# Patient Record
Sex: Female | Born: 1955
Health system: Southern US, Community
[De-identification: ages and names within clinical notes are randomized; demographics above are authoritative.]

## PROBLEM LIST (undated history)

## (undated) DIAGNOSIS — E7800 Pure hypercholesterolemia, unspecified: Secondary | ICD-10-CM

## (undated) DIAGNOSIS — F329 Major depressive disorder, single episode, unspecified: Secondary | ICD-10-CM

## (undated) DIAGNOSIS — F419 Anxiety disorder, unspecified: Secondary | ICD-10-CM

## (undated) DIAGNOSIS — IMO0002 Reserved for concepts with insufficient information to code with codable children: Secondary | ICD-10-CM

## (undated) DIAGNOSIS — E039 Hypothyroidism, unspecified: Secondary | ICD-10-CM

## (undated) DIAGNOSIS — F32A Depression, unspecified: Secondary | ICD-10-CM

## (undated) DIAGNOSIS — G4733 Obstructive sleep apnea (adult) (pediatric): Secondary | ICD-10-CM

## (undated) DIAGNOSIS — G43909 Migraine, unspecified, not intractable, without status migrainosus: Secondary | ICD-10-CM

## (undated) DIAGNOSIS — M329 Systemic lupus erythematosus, unspecified: Secondary | ICD-10-CM

## (undated) HISTORY — DX: Anxiety disorder, unspecified: F41.9

## (undated) HISTORY — DX: Systemic lupus erythematosus, unspecified: M32.9

## (undated) HISTORY — DX: Obstructive sleep apnea (adult) (pediatric): G47.33

## (undated) HISTORY — DX: Migraine, unspecified, not intractable, without status migrainosus: G43.909

## (undated) HISTORY — DX: Pure hypercholesterolemia, unspecified: E78.00

## (undated) HISTORY — DX: Major depressive disorder, single episode, unspecified: F32.9

## (undated) HISTORY — DX: Depression, unspecified: F32.A

## (undated) HISTORY — DX: Hypothyroidism, unspecified: E03.9

## (undated) HISTORY — DX: Reserved for concepts with insufficient information to code with codable children: IMO0002

---

## 2006-02-23 ENCOUNTER — Ambulatory Visit: Payer: Self-pay | Admitting: Family Medicine

## 2007-12-15 ENCOUNTER — Ambulatory Visit: Payer: Self-pay | Admitting: Family Medicine

## 2011-09-21 ENCOUNTER — Ambulatory Visit: Payer: Self-pay | Admitting: Family Medicine

## 2012-10-18 ENCOUNTER — Other Ambulatory Visit (HOSPITAL_BASED_OUTPATIENT_CLINIC_OR_DEPARTMENT_OTHER): Payer: Self-pay | Admitting: Rheumatology

## 2012-10-18 DIAGNOSIS — M329 Systemic lupus erythematosus, unspecified: Secondary | ICD-10-CM

## 2012-10-18 DIAGNOSIS — R413 Other amnesia: Secondary | ICD-10-CM

## 2012-10-20 ENCOUNTER — Other Ambulatory Visit (HOSPITAL_BASED_OUTPATIENT_CLINIC_OR_DEPARTMENT_OTHER): Payer: Self-pay | Admitting: Rheumatology

## 2012-10-20 DIAGNOSIS — M329 Systemic lupus erythematosus, unspecified: Secondary | ICD-10-CM

## 2012-10-20 DIAGNOSIS — R413 Other amnesia: Secondary | ICD-10-CM

## 2012-10-29 ENCOUNTER — Ambulatory Visit (HOSPITAL_BASED_OUTPATIENT_CLINIC_OR_DEPARTMENT_OTHER)
Admission: RE | Admit: 2012-10-29 | Discharge: 2012-10-29 | Disposition: A | Discharge status: Home / Self Care | Payer: BC Managed Care – PPO | Source: Ambulatory Visit | Attending: Rheumatology | Admitting: Rheumatology

## 2012-10-29 ENCOUNTER — Other Ambulatory Visit (HOSPITAL_BASED_OUTPATIENT_CLINIC_OR_DEPARTMENT_OTHER): Payer: BC Managed Care – PPO

## 2012-10-29 DIAGNOSIS — I6789 Other cerebrovascular disease: Secondary | ICD-10-CM | POA: Insufficient documentation

## 2012-10-29 DIAGNOSIS — R413 Other amnesia: Secondary | ICD-10-CM

## 2012-10-29 DIAGNOSIS — M329 Systemic lupus erythematosus, unspecified: Secondary | ICD-10-CM

## 2012-12-20 ENCOUNTER — Other Ambulatory Visit: Payer: Self-pay | Admitting: Family Medicine

## 2012-12-20 ENCOUNTER — Ambulatory Visit
Admission: RE | Admit: 2012-12-20 | Discharge: 2012-12-20 | Disposition: A | Discharge status: Home / Self Care | Payer: No Typology Code available for payment source | Source: Ambulatory Visit | Attending: Family Medicine | Admitting: Family Medicine

## 2012-12-20 DIAGNOSIS — M329 Systemic lupus erythematosus, unspecified: Secondary | ICD-10-CM

## 2012-12-20 DIAGNOSIS — Z006 Encounter for examination for normal comparison and control in clinical research program: Secondary | ICD-10-CM

## 2012-12-20 DIAGNOSIS — F341 Dysthymic disorder: Secondary | ICD-10-CM

## 2012-12-20 DIAGNOSIS — R413 Other amnesia: Secondary | ICD-10-CM

## 2012-12-22 DIAGNOSIS — R413 Other amnesia: Secondary | ICD-10-CM

## 2012-12-22 DIAGNOSIS — F341 Dysthymic disorder: Secondary | ICD-10-CM

## 2013-01-20 ENCOUNTER — Telehealth: Payer: Self-pay | Admitting: Neurology

## 2013-01-20 NOTE — Telephone Encounter (Signed)
Pls call pt to schedule FU w me, after her recent sleep study. Pls route results to Dr. Pearlean Brownie for review.  thx s

## 2013-01-25 ENCOUNTER — Encounter: Payer: Self-pay | Admitting: Neurology

## 2013-01-26 ENCOUNTER — Ambulatory Visit (INDEPENDENT_AMBULATORY_CARE_PROVIDER_SITE_OTHER): Payer: BC Managed Care – PPO | Admitting: Neurology

## 2013-01-26 ENCOUNTER — Encounter: Payer: Self-pay | Admitting: Neurology

## 2013-01-26 VITALS — BP 120/80 | HR 79 | Temp 98.5°F | Ht >= 80 in | Wt 162.0 lb

## 2013-01-26 DIAGNOSIS — H547 Unspecified visual loss: Secondary | ICD-10-CM

## 2013-01-26 DIAGNOSIS — R0683 Snoring: Secondary | ICD-10-CM | POA: Insufficient documentation

## 2013-01-26 DIAGNOSIS — E039 Hypothyroidism, unspecified: Secondary | ICD-10-CM

## 2013-01-26 DIAGNOSIS — R404 Transient alteration of awareness: Secondary | ICD-10-CM

## 2013-01-26 DIAGNOSIS — G2581 Restless legs syndrome: Secondary | ICD-10-CM | POA: Insufficient documentation

## 2013-01-26 DIAGNOSIS — F411 Generalized anxiety disorder: Secondary | ICD-10-CM

## 2013-01-26 DIAGNOSIS — R0989 Other specified symptoms and signs involving the circulatory and respiratory systems: Secondary | ICD-10-CM

## 2013-01-26 DIAGNOSIS — R0609 Other forms of dyspnea: Secondary | ICD-10-CM

## 2013-01-26 DIAGNOSIS — R400 Somnolence: Secondary | ICD-10-CM | POA: Insufficient documentation

## 2013-01-26 DIAGNOSIS — M329 Systemic lupus erythematosus, unspecified: Secondary | ICD-10-CM

## 2013-01-26 DIAGNOSIS — R413 Other amnesia: Secondary | ICD-10-CM

## 2013-01-26 NOTE — Patient Instructions (Addendum)
Recommend she follow up with Dr. Merton Border are 4 discussion for treatment options for sleep apnea. Continue B12 injections. Followup with Dr. Corliss Skains for treatment for lupus. No further diagnostic neurological testing is necessary at this point. No followup with me his necessary.

## 2013-01-29 NOTE — Progress Notes (Addendum)
HPI: Ms  Dawn Butler is a 57 year old Caucasian lady with long-standing symptoms of short-term memory and mild cognitive impairment on cardiology. She has suboptimally treated underlying depression and recently been diagnosed with nonspecific white matter hyperintensities on brain MRI scan and diagnosis of possible lupus and recent workup shows mild vitamin B12 deficiency and sleep apnea. She returns today for followup of her last visit on 11/16/2012. She continues to have significant multifocal symptoms which are unchanged. March mild short-term memory difficulties and trouble concentration and performance at work continue unabated. She had an EEG done on 11/22/2012 office which was normal. Brainstem and visual evoked potentials dated 11/29/2012 were both normal as well. Lab work on 11/16/2012 showed slightly elevated TSH of 7.5  and homocysteine of 20.5. T4-T3 uptake and free throxine  index were however normal. RPR was nonreactive. Vitamin B12 was low at 195. She has been started on B12 shots primary physician. Polysomnogram sleep study on 12/28/2012 shows evidence of mild sleep apnea. She has an upcoming visit for CPAP titration with Dr. Frances Furbish in April. Neuropsychological testing done on 12/20/2012 and 12/22/2012 by Dr. Leonides Cave showed a normal neurocognitive profile. Specifically functioning within the areas of attention/concentration, memory and negative functioning, language and visuospatial organization and constructional praxis and fine motor skills was normal or above normal. Neuropsychological evaluation suggested that she has overestimated or exaggerated her cognitive problems. As the discrepancy between her symptom report and performance on neuropsychological testing was exceedingly large. ROS: 14 system review of systems is positive for fatigue, rash, visual floaters, snoring, joint pain, skin sensitivity, memory loss, confusion, dizziness, sleepiness, snoring, restless legs, depression, racing thoughts, not  enough sleep and decreased energy. Physical Exam General: well developed, well nourished, seated, in no evident distress Head: head normocephalic and atraumatic. Orohparynx benign Neck: supple with no carotid or supraclavicular bruits Cardiovascular: regular rate and rhythm, no murmurs Musculoskeletal no deformity Skin no rash  Neurologic Exam Mental Status: Awake and fully alert. Oriented to place and time. Recent and remote memory intact. Attention span, concentration and fund of knowledge appropriate. Mood and affect appropriate.  Cranial Nerves: Fundoscopic exam reveals sharp disc margins. Pupils equal, briskly reactive to light. Extraocular movements full without nystagmus. Visual fields full to confrontation. Hearing intact and symmetric to finer snap. Facial sensation intact. Face, tongue, palate move normally and symmetrically. Neck flexion and extension normal.  Motor: Normal bulk and tone. Normal strength in all tested extremity muscles. Sensory.: intact to tough and pinprick and vibratory.  Coordination: Rapid alternating movements normal in all extremities. Finger-to-nose and heel-to-shin performed accurately bilaterally. Gait and Station: Arises from chair without difficulty. Stance is normal. Gait demonstrates normal stride length and balance . Able to heel, toe and tandem walk without difficulty.  Reflexes: 1+ and symmetric. Toes downgoing.     ASSESSMENT::  57 year old lady with long-standing symptoms of short-term memory and mild cognitive impairment of unclear etiology with underlying depression. She has recently been diagnosed with mild B12 deficiency and sleep apnea which may be contributing.    PLAN: Continue B12 replacement and keep upcoming appointment  for CPAP titration. Return for followup in the future only as necessary.

## 2013-02-08 ENCOUNTER — Ambulatory Visit (INDEPENDENT_AMBULATORY_CARE_PROVIDER_SITE_OTHER): Payer: BC Managed Care – PPO | Admitting: Neurology

## 2013-02-08 ENCOUNTER — Encounter: Payer: Self-pay | Admitting: Neurology

## 2013-02-08 VITALS — BP 140/84 | HR 76 | Temp 98.0°F | Ht 68.0 in | Wt 160.0 lb

## 2013-02-08 DIAGNOSIS — M329 Systemic lupus erythematosus, unspecified: Secondary | ICD-10-CM

## 2013-02-08 DIAGNOSIS — G4733 Obstructive sleep apnea (adult) (pediatric): Secondary | ICD-10-CM | POA: Insufficient documentation

## 2013-02-08 DIAGNOSIS — G2581 Restless legs syndrome: Secondary | ICD-10-CM

## 2013-02-08 DIAGNOSIS — R413 Other amnesia: Secondary | ICD-10-CM

## 2013-02-08 HISTORY — DX: Obstructive sleep apnea (adult) (pediatric): G47.33

## 2013-02-08 NOTE — Patient Instructions (Addendum)
I think overall you are doing fairly well, but I do want to suggest treating your sleep apnea with CPAP. We will therefore, schedule you for a repeat sleep study for titration. I will see you back afterwards.   Remember to drink plenty of fluid, eat healthy meals and do not skip any meals. Try to eat protein with a every meal and eat a healthy snack such as fruit or nuts in between meals. Try to keep a regular sleep-wake schedule and try to exercise daily, particularly in the form of walking, 20-30 minutes a day, if you can.   As far as your medications are concerned, I would like to suggest no changes.   Please also call us for any test results so we can go over those with you on the phone. Brett Canales is my clinical assistant and will answer any of your questions and relay your messages to me and also relay most of my messages to you.  Our phone number is [phone]. We also have an after hours call service for urgent matters and there is a physician on-call for urgent questions. For any emergencies you know to call 911 or go to the nearest emergency room.

## 2013-02-08 NOTE — Progress Notes (Signed)
Subjective:    Patient ID: Dawn Butler is a 57 y.o. female.  HPI  Interim history:  Dawn Butler is a very pleasant 57 year old right-handed woman who presents for followup consultation of her sleep disorder. She is unaccompanied today. I first met her on 12/22/2012 at which time she was complaining of daytime sleepiness and restless leg symptoms. She has an underlying medical history of hypothyroidism, hyperlipidemia, migraine headaches, depression, anxiety and lupus. I asked her to come back for sleep study and she had a baseline sleep study on 12/28/2012. I went over her test results with her in detail today. Her sleep efficiency was normal at 91.7% with a latency to sleep of 22.5 minutes and wake after sleep onset of 13 minutes with mild sleep fragmentation noted. Her arousal index was normal at 7.5 arousals per hour. She had a normal percentage of stage I sleep and increased percentage of stage II sleep, and decreased percentage of slow-wave sleep at 5.6% and a mildly decreased percentage of REM sleep at 16.8% with a latency to REM sleep of 102.5 minutes. She had mild pruritic leg movements at 15.2 per hour resulting in very little arousals of 0.9 per hour. Mild intermittent snoring was noted. She had an AHI of 6.5 per hour, rising to 25.5 per hour and REM sleep and 17.9 per hour in the supine position. Her baseline oxygen saturation was noted to be 94% and her nadir was 84% and REM sleep.  She has recently also seen Dr. Pearlean Brownie for her memory loss and has undergone blood work, EEG and evoked potential is. Her TSH was elevated at 7.52. Her EEG was reported as normal in the awake state. She has been started on be vitamins and had an increase in her Synthroid. Her homocystine level was 20.5. Her current medications are baby aspirin once daily, Cymbalta 60 mg daily, levothyroxine, Plaquenil. She has seen Dr. Pearlean Brownie in FU on 01/26/13 and he explained her neuropsych test results to her. She feels worse in the  last month, with respect to pain, feeling fatigued and about a month ago she furnished a report at work and did not have recollection that she already did this report and tried to make another report.  She is wondering if she is having a lupus flare up. Her appointment with her rheumatologist is not until August.  Her Past Medical History Is Significant For: Past Medical History  Diagnosis Date  . Hypothyroidism   . High cholesterol   . Migraine   . Depression   . Anxiety     Her Past Surgical History Is Significant For: No past surgical history on file.  Her Family History Is Significant For: Family History  Problem Relation Age of Onset  . Cancer - Lung Maternal Grandmother   . Cancer Maternal Grandmother     thorat    Her Social History Is Significant For: History   Social History  . Marital Status: Married    Spouse Name: N/A    Number of Children: 1  . Years of Education: college   Occupational History  . [employer]    Social History Main Topics  . Smoking status: Current Every Day Smoker -- 1.50 packs/day  . Smokeless tobacco: None  . Alcohol Use: None  . Drug Use: None  . Sexually Active: None   Other Topics Concern  . None   Social History Narrative  . None    Her Allergies Are:  Allergies  Allergen Reactions  . Naprosyn (  Naproxen)   :   Her Current Medications Are:  Outpatient Encounter Prescriptions as of 02/08/2013  Medication Sig Dispense Refill  . aspirin 81 MG tablet Take 81 mg by mouth daily.      . DULoxetine (CYMBALTA) 60 MG capsule       . hydroxychloroquine (PLAQUENIL) 200 MG tablet       . levothyroxine (SYNTHROID, LEVOTHROID) 112 MCG tablet       . LOTEMAX 0.5 % ophthalmic suspension       . vitamin B-12 (CYANOCOBALAMIN) 1000 MCG tablet Take 1,000 mcg by mouth daily.      . Vitamin D, Ergocalciferol, (DRISDOL) 50000 UNITS CAPS Take 1 Units by mouth every 30 (thirty) days.        No facility-administered encounter medications on  file as of 02/08/2013.  :  Review of Systems  Constitutional: Positive for fatigue.  Eyes: Positive for visual disturbance (blurred vision).  Respiratory:       Snoring  Endocrine: Positive for polydipsia.  Musculoskeletal: Positive for joint swelling and arthralgias.  Skin: Positive for rash.       Itching, sensitivity  Neurological: Positive for dizziness, weakness and numbness.       Memory loss, restless leg  Psychiatric/Behavioral: Positive for confusion and dysphoric mood.       Decreased energy    Objective:  Neurologic Exam  Physical Exam Physical Examination:   Filed Vitals:   02/08/13 0907  BP: 140/84  Pulse: 76  Temp: 98 F (36.7 C)    General Examination: The patient is a very pleasant 57 y.o. female in no acute distress. She is in good spirits today.  On general exam: She is a very pleasant middle-aged woman in no acute distress. HEENT exam: Normocephalic, atraumatic, pupils are equal, round and reactive to light and accommodation. Funduscopic exam is normal. Extraocular tracking is good without nystagmus. Hearing is intact. Airway examination reveals a Mallampati class I. She has a anatomically mildly narrow airway but otherwise no significant crowding. Tonsils are small. Uvula is on the smaller side and tongue is normal in size, protrudes centrally. Palate elevates symmetrically. Speech is clear. She wears corrective glasses. Face is symmetric with normal facial animation. Chest is clear to auscultation without wheezing or rhonchi. Heart sounds are normal without murmurs, rubs or gallops noted. Abdomen is soft, nontender with normal bowel sounds noted. She has no pitting edema in the distal lower extremities. Skin is warm and dry. She has no trophic changes. She has no obvious joint deformities. Neurologically: Mental status: The patient is awake, alert and oriented in all 4 spheres. Her memory, attention, language and knowledge are fairly appropriate. At times,  she struggles with history but details. Cranial nerves are as described under HEENT exam. Motor exam: Normal bulk, strength and tone is noted. She has no drift or tremor or rebound. Romberg is negative. Cerebellar testing shows no dysmetria or intention tremor and intact fine motor skills. She has no truncal or gait ataxia. Sensory exam is intact to light touch, pinprick, vibration and temperature sense. Gait, station and balance are unremarkable. She is able to stand on her toes and stand on her heels. She is able to tandem walk. Reflexes are 2+.   Assessment and Plan:   Assessment and Plan:    In summary, Ms. Matsunaga is a 57 year-old lady with a recent Dx of Lupus, and about 2 y Hx of short-term memory problems, more pronounced in the last 10 mo. She reports recent  increase in BP and in the past months feels worse with respect to pain, exhaustion, and her memory. She had a benign neuropsych testing recently and does have a recent finding of overall mild OSA. Because she drops her O2 to the 80s and in the face of sleep related complaints, as well as recent rise in BP, I think we should treat her OSA. It is moderate in supine and REM sleep. I talked to her about the diagnosis of OSA, its prognosis and treatment options. She is willing to embark on treatment with CPAP and I explained to her that this will require a repeat sleep study for proper titration. She is in agreement. Furthermore, suggested some labs including TSH, ANA, ESR, CRP, vitamin D, vitamin B12 today. I reviewed labs from February and December that she brought. She is advised that we will schedule her for followup after the sleep studies completed and after I have had a chance to prescribe the CPAP unit for for use at home. She is encouraged to call next week for her lab test results. I also urged her to call with any interim questions, concerns, problems or update. I suggested no new medications today.

## 2013-02-13 ENCOUNTER — Telehealth: Payer: Self-pay | Admitting: *Deleted

## 2013-02-13 LAB — ANA: Anti Nuclear Antibody(ANA): POSITIVE — AB

## 2013-02-13 LAB — VITAMIN D 1,25 DIHYDROXY: Vit D, 1,25-Dihydroxy: 132 pg/mL — ABNORMAL HIGH (ref 10.0–75.0)

## 2013-02-13 LAB — PLEASE NOTE

## 2013-02-13 LAB — C-REACTIVE PROTEIN: CRP: 2.7 mg/L (ref 0.0–4.9)

## 2013-02-13 LAB — TSH: TSH: 1.32 u[IU]/mL (ref 0.450–4.500)

## 2013-02-13 LAB — SEDIMENTATION RATE: Sed Rate: 2 mm/hr (ref 0–40)

## 2013-02-13 NOTE — Progress Notes (Signed)
Quick Note:  Please call and advise the patient that the recent labs we checked were within normal limits, with the exception of a positive ANA. This is in keeping with her diagnosis of lupus. However, other inflammatory markers such as sedimentation rate and CRP are within normal limits, suggesting no significant inflammation or perhaps a flareup of her lupus. I believe she can just keep her scheduled appointment with rheumatology and may not need to move it up unless she wishes to do so. Please remind patient to keep any upcoming appointments and call with any interim questions, concerns, problems or updates. Thanks,  Huston Foley, MD, PhD   ______

## 2013-02-13 NOTE — Telephone Encounter (Signed)
Left vm on both home and mobile requesting a return call for lab results.  I did state that there was no cause to be overly concerned.

## 2013-02-14 NOTE — Progress Notes (Signed)
Quick Note:  Spoke with patient and relayed results of blood work. Patient understood and had no questions.  ______ 

## 2013-02-14 NOTE — Telephone Encounter (Signed)
Called pt's work # per [employer] instructions; no answer so I left another vm requesting a call back.

## 2013-03-01 ENCOUNTER — Ambulatory Visit (INDEPENDENT_AMBULATORY_CARE_PROVIDER_SITE_OTHER): Payer: BC Managed Care – PPO

## 2013-03-01 DIAGNOSIS — R413 Other amnesia: Secondary | ICD-10-CM

## 2013-03-01 DIAGNOSIS — G2581 Restless legs syndrome: Secondary | ICD-10-CM

## 2013-03-01 DIAGNOSIS — G4733 Obstructive sleep apnea (adult) (pediatric): Secondary | ICD-10-CM

## 2013-03-10 ENCOUNTER — Telehealth: Payer: Self-pay | Admitting: Neurology

## 2013-03-10 DIAGNOSIS — G4733 Obstructive sleep apnea (adult) (pediatric): Secondary | ICD-10-CM

## 2013-03-10 NOTE — Telephone Encounter (Signed)
Please arrange for CPAP set up at home and fax/route report to PCP and Dr. Pearlean Brownie. Patient needs FU appointment with me in 6-8 weeks post set up, thanks.

## 2013-04-13 ENCOUNTER — Encounter: Payer: Self-pay | Admitting: Neurology

## 2013-04-28 NOTE — Progress Notes (Signed)
Quick Note:  I reviewed the patient's CPAP compliance data from 03/24/2013 to 04/10/2013, which is a total of 18 days, during which time the patient used CPAP every day except for 1 day. The average usage for all days was 7 hours and 26 minutes. The percent used days greater than 4 hours was 94 %, indicating excellent compliance. The residual AHI was 1.1 per hour, indicating an good treatment pressure of 7 cwp. I will review this data with the patient at the next visit.  Huston Foley, MD, PhD [facility]   ______

## 2013-06-02 ENCOUNTER — Encounter: Payer: Self-pay | Admitting: Neurology

## 2013-06-12 NOTE — Progress Notes (Signed)
Quick Note:  I reviewed the patient's CPAP compliance data from 3 2014 to 05/05/2013, which is a total of 43 days, during which time the patient used CPAP every day. The average usage for all days was 7 hours and 34 minutes. The percent used days greater than 4 hours was 97 %, indicating excellent compliance. The residual AHI was 1 per hour, indicating a good treatment pressure of 7 cwp. I will review this data with the patient at the next visit, provide feedback and additional trouble shooting if need be.  Huston Foley, MD, PhD [facility] ([facility]   ______

## 2013-06-14 ENCOUNTER — Encounter: Payer: Self-pay | Admitting: Neurology

## 2013-06-14 ENCOUNTER — Ambulatory Visit (INDEPENDENT_AMBULATORY_CARE_PROVIDER_SITE_OTHER): Payer: BC Managed Care – PPO | Admitting: Neurology

## 2013-06-14 VITALS — BP 114/77 | HR 69 | Temp 97.8°F | Ht 65.5 in | Wt 161.0 lb

## 2013-06-14 DIAGNOSIS — G4733 Obstructive sleep apnea (adult) (pediatric): Secondary | ICD-10-CM

## 2013-06-14 DIAGNOSIS — R413 Other amnesia: Secondary | ICD-10-CM

## 2013-06-14 DIAGNOSIS — M329 Systemic lupus erythematosus, unspecified: Secondary | ICD-10-CM

## 2013-06-14 DIAGNOSIS — Z9989 Dependence on other enabling machines and devices: Secondary | ICD-10-CM

## 2013-06-14 DIAGNOSIS — G2581 Restless legs syndrome: Secondary | ICD-10-CM

## 2013-06-14 MED ORDER — PRAMIPEXOLE DIHYDROCHLORIDE 0.125 MG PO TABS: ORAL_TABLET | ORAL | Status: AC

## 2013-06-14 NOTE — Patient Instructions (Addendum)
Please continue using your CPAP regularly. While your insurance requires that you use CPAP at least 4 hours each night on 70% of the nights, I recommend, that you not skip any nights and use it throughout the night if you can. Getting used to CPAP does take time and patience and discipline. Untreated obstructive sleep apnea when it is moderate to severe can have an adverse impact on cardiovascular health and raise her risk for heart disease, arrhythmias, hypertension, congestive heart failure, stroke and diabetes. Untreated obstructive sleep apnea causes sleep disruption, nonrestorative sleep, and sleep deprivation. This can have an impact on your day to day functioning and cause daytime sleepiness and impairment of cognitive function, memory loss, mood disturbance, and problems focussing. Using CPAP regularly can improve these symptoms.  Please come back for Follow up in 6 months. Please change your filter every other month, and ask [facility] about additional filters.   For your RLS we are starting a new medication, called Mirapex (pramipexole) 0.125 mg: Take 1 pill each night for 1 week, the 2 pills each night for 1 week, then 3 pills each night thereafter. Common side effects reported are: Sedation, sleepiness, nausea, vomiting, and rare side effects are confusion, hallucinations, swelling in legs.

## 2013-06-14 NOTE — Progress Notes (Signed)
Subjective:    Patient ID: Dawn Butler is a 57 y.o. female.  HPI  Interim history:   Dawn Butler is a very pleasant 57 year old right-handed woman with an underlying medical history of hypothyroidism, hyperlipidemia, migraine headaches, depression, anxiety and lupus who presents after her recent CPAP titration study. Dawn Butler is unaccompanied today. I first saw her on 12/22/2012, which time Dawn Butler was reporting daytime sleepiness and RLS symptoms. Dawn Butler had baseline sleep study on 12/28/2012. As far back on 02/08/2013 at which time I talked her about her sleep test results. Dawn Butler had a mildly elevated AHI of 6.5 per hour, rising to 25.5 per hour and REM sleep. Her baseline oxygen saturation was 94%, her nadir was 84% in rem sleep. Based on those test results I suggested that Dawn Butler come back for CPAP titration to treat her OSA. Dawn Butler had this test on 03/01/2013 and I went over those test results with her in detail today. Sleep efficiency was mildly reduced at 81.8% with a latency to sleep of 26.5 minutes. Dawn Butler had an increased percentage for light stage sleep, near absence of slow-wave sleep and a normal percentage of REM sleep. Dawn Butler had a mildly prolonged REM latency. Dawn Butler had moderate periodic leg movements of sleep. The PLM index was 25.3 per hour resulting in 5.3 arousals per hour. Dawn Butler was titrated on CPAP from 5-7 cm and had complete resolution of her sleep disordered breathing including snoring on a pressure of 7 cm. I also reviewed CPAP compliance data from her most recent download, from 03/31/2013 through 06/13/2013 which is a total of 75 days during which time Dawn Butler used it every day. Her average usage was 7 hours and 30 minutes. Percent used days greater than 4 hours was 98%, indicating excellent compliance. Her residual AHI was 1.2 per hour indicating a very good treatment pressure of 7 cm of water, with EPR level of 3.  Dawn Butler feels less tired during the day and better rested since the CPAP. Dawn Butler has the ramp set at 20  min, and would like to reduce that. Dawn Butler brought in her machine in for compliance today and to inspect if needed. Dawn Butler has not washed or changed her filter in a while. Dawn Butler has been using the humidifier at a higher setting. Dawn Butler has noticed a slight improvement perhaps in her memory and Dawn Butler's not as sleep deprived and not as tired. Dawn Butler is going to see another rheumatologist in consultation at [facility] in October for her memory concerns with underlying history of lupus. Dawn Butler was referred by her current rheumatologist, Dr. Dareen Piano.  Dawn Butler had recently also seen Dr. Pearlean Brownie for her memory loss and has undergone blood work, EEG and evoked potentials. Her TSH was elevated at 7.52. Her EEG was reported as normal in the awake state. Dawn Butler has been started on be vitamins and had an increase in her Synthroid. Her homocystine level was 20.5. Her current medications are baby aspirin once daily, Cymbalta 60 mg daily, levothyroxine, Plaquenil. Dawn Butler has seen Dr. Pearlean Brownie in [place] on 01/26/13 and he explained her neuropsych test results to her.   Dawn Butler started seeing a new Dr. For her lupus in June and saw him (Dr. Dareen Piano) back in July and was started on low dose Prednisone 5 mg daily. Since then Dawn Butler has had more issues with insomnia and also a flareup in her RLS symptoms. Of note Dawn Butler did take in her sleep. Her PLM index was right at 25 per hour with an arousal index of 5 per  hour.   Her Past Medical History Is Significant For: Past Medical History  Diagnosis Date  . Hypothyroidism   . High cholesterol   . Migraine   . Depression   . Anxiety   . OSA (obstructive sleep apnea) 02/08/2013    Her Past Surgical History Is Significant For: History reviewed. No pertinent past surgical history.  Her Family History Is Significant For: Family History  Problem Relation Age of Onset  . Cancer - Lung Maternal Grandmother   . Cancer Maternal Grandmother     thorat    Her Social History Is Significant For: History   Social History   . Marital Status: Married    Spouse Name: N/A    Number of Children: 1  . Years of Education: college   Occupational History  . [employer]    Social History Main Topics  . Smoking status: Current Every Day Smoker -- 1.50 packs/day  . Smokeless tobacco: None  . Alcohol Use: None  . Drug Use: None  . Sexual Activity: None   Other Topics Concern  . None   Social History Narrative  . None    Her Allergies Are:  Allergies  Allergen Reactions  . Naprosyn [Naproxen]   :   Her Current Medications Are:  Outpatient Encounter Prescriptions as of 06/14/2013  Medication Sig Dispense Refill  . aspirin 81 MG tablet Take 81 mg by mouth daily.      . DULoxetine (CYMBALTA) 60 MG capsule       . hydroxychloroquine (PLAQUENIL) 200 MG tablet       . levothyroxine (SYNTHROID, LEVOTHROID) 112 MCG tablet Take 100 mcg by mouth.       . LOTEMAX 0.5 % ophthalmic suspension       . prednisoLONE 5 MG TABS tablet Take 5 mg by mouth daily.      . vitamin B-12 (CYANOCOBALAMIN) 1000 MCG tablet Take 1,000 mcg by mouth daily.      . Vitamin D, Ergocalciferol, (DRISDOL) 50000 UNITS CAPS Take 2,000 Units by mouth daily.        No facility-administered encounter medications on file as of 06/14/2013.   Review of Systems  Constitutional: Positive for fever, chills and fatigue.  Eyes: Positive for visual disturbance (blurred vision).  Endocrine: Positive for cold intolerance (feeling cold) and heat intolerance (feeling hot).  Musculoskeletal: Positive for joint swelling and arthralgias.  Neurological: Positive for weakness.       Memory loss Confusion  Hematological: Bruises/bleeds easily.  Psychiatric/Behavioral: Positive for sleep disturbance (insomnia, restless legs, not enough sleep).       Disinterest in activities    Objective:  Neurologic Exam  Physical Exam Physical Examination:   Filed Vitals:   06/14/13 0905  BP: 114/77  Pulse: 69  Temp: 97.8 F (36.6 C)    General  Examination: The patient is a very pleasant 57 y.o. female in no acute distress. Dawn Butler is in very good spirits today.  HEENT exam: Normocephalic, atraumatic, pupils are equal, round and reactive to light and accommodation. Funduscopic exam is normal. Extraocular tracking is good without nystagmus. Hearing is intact. Airway examination reveals a Mallampati class I. Dawn Butler has a anatomically mildly narrow airway but otherwise no significant crowding. Tonsils are small. Uvula is on the smaller side and tongue is normal in size, protrudes centrally. Palate elevates symmetrically. Speech is clear. Dawn Butler wears corrective glasses. Face is symmetric with normal facial animation. Chest is clear to auscultation without wheezing or rhonchi. Heart sounds are normal  without murmurs, rubs or gallops noted. Abdomen is soft, nontender with normal bowel sounds noted. Dawn Butler has no pitting edema in the distal lower extremities. Skin is warm and dry. Dawn Butler has no trophic changes. Dawn Butler has no obvious joint deformities. Neurologically: Mental status: The patient is awake, alert and oriented in all 4 spheres. Her memory, attention, language and knowledge are fairly appropriate. At times, Dawn Butler struggles with history but details. Cranial nerves are as described under HEENT exam. Motor exam: Normal bulk, strength and tone is noted. Dawn Butler has no drift or tremor or rebound. Romberg is negative. Cerebellar testing shows no dysmetria or intention tremor and intact fine motor skills. Dawn Butler has no truncal or gait ataxia. Sensory exam is intact to light touch, pinprick, vibration and temperature sense. Gait, station and balance are unremarkable. Reflexes are 2+.   Assessment and Plan:    In summary, Dawn Butler is a 57 year-old lady with a recent Dx of Lupus, and about 2 y Hx of short-term memory problems, more pronounced in the last year. Dawn Butler was recently diagnosed with obstructive sleep apnea and has since then been established on CPAP. Dawn Butler is very  compliant with treatment and indicates fairly good tolerance of the pressure, the humidity, and the mask. Dawn Butler feels improved with respect to her daytime tiredness and her fatigue and energy level. Her memory is perhaps a little bit better in her perspective. Dawn Butler requested decreasing her ramp time and I turned it down to 5 minutes. I asked her to change her filter and Dawn Butler happened to have another new filter in her back today and Dawn Butler changed it in the office. Dawn Butler also is advised to change the filter every 2 months. Dawn Butler is advised to continue to be compliant with CPAP treatment. Dawn Butler is congratulated on her great treatment adherence. Dawn Butler is now established with a new rheumatologist and also has an appointment pending at [facility] with another rheumatology specialist. Regarding her RLS symptoms which have flared up I suggested that we try her on Mirapex. I also pointed out to her that Cymbalta can exacerbate PLMS or RLS. Since Dawn Butler is having good results with Cymbalta there is no reason to change that medicine at this time. I suggested Mirapex low-dose her 0.125 mg strength one pill each night for one week and then increase it each week by 0.125 mg to a total of 0.375 mg in 3 weeks. I provided her with a new prescription and would like to see her back in 6 months from now, sooner if the need arises and we talked about potential side effects with a new medication and I wrote this down for her as well. I have encouraged her to call me with any interim questions, concerns, problems, updates or refill requests. Dawn Butler was in agreement.

## 2013-06-21 ENCOUNTER — Encounter: Payer: Self-pay | Admitting: Neurology

## 2013-07-13 ENCOUNTER — Ambulatory Visit: Payer: Self-pay | Admitting: Family Medicine

## 2013-07-17 ENCOUNTER — Telehealth: Payer: Self-pay | Admitting: Neurology

## 2013-07-17 NOTE — Sleep Study (Signed)
Please ask patient to reduce Mirapex to 2 pills each night for 3 days, then one pill each night for 3 days, then stop altogether. Please have her report back after about a week of being off of the medication regarding her nasal congestion.

## 2013-07-17 NOTE — Telephone Encounter (Signed)
Pt says that she was prescribed Mirapex and has had difficulty sleeping.  Feels that her sleeping has become worse.  Says that her sinuses have become swollen, she has been unable to breathe, since using the medication and it has caused her inability to use the cpap machine compliantly.  Says that her next download will look bad because of this.  She feels that she needs to come off of the medication.  Please advise.  [phone] if calling before 4pm.

## 2013-07-20 NOTE — Telephone Encounter (Signed)
I called and spoke to Dawn Butler and relayed the response from Dr. Frances Furbish about tapering off the mirapex and calling us in a week to let us know how she is.

## 2013-12-14 ENCOUNTER — Ambulatory Visit: Payer: BC Managed Care – PPO | Admitting: Neurology

## 2014-11-13 ENCOUNTER — Ambulatory Visit: Payer: Self-pay | Admitting: Family Medicine

## 2016-03-27 ENCOUNTER — Other Ambulatory Visit: Payer: Self-pay | Admitting: Neurology

## 2016-03-27 DIAGNOSIS — M3219 Other organ or system involvement in systemic lupus erythematosus: Secondary | ICD-10-CM

## 2016-04-01 ENCOUNTER — Other Ambulatory Visit: Payer: Self-pay | Admitting: Neurology

## 2016-04-01 DIAGNOSIS — R413 Other amnesia: Secondary | ICD-10-CM

## 2016-04-02 ENCOUNTER — Other Ambulatory Visit
Admission: RE | Admit: 2016-04-02 | Discharge: 2016-04-02 | Disposition: A | Discharge status: Home / Self Care | Payer: Managed Care, Other (non HMO) | Source: Ambulatory Visit | Attending: Neurology | Admitting: Neurology

## 2016-04-02 ENCOUNTER — Ambulatory Visit
Admission: RE | Admit: 2016-04-02 | Discharge: 2016-04-02 | Disposition: A | Discharge status: Home / Self Care | Payer: Managed Care, Other (non HMO) | Source: Ambulatory Visit | Attending: Neurology | Admitting: Neurology

## 2016-04-02 DIAGNOSIS — R9089 Other abnormal findings on diagnostic imaging of central nervous system: Secondary | ICD-10-CM | POA: Insufficient documentation

## 2016-04-02 DIAGNOSIS — Z01812 Encounter for preprocedural laboratory examination: Secondary | ICD-10-CM | POA: Diagnosis present

## 2016-04-02 DIAGNOSIS — I739 Peripheral vascular disease, unspecified: Secondary | ICD-10-CM | POA: Insufficient documentation

## 2016-04-02 DIAGNOSIS — R413 Other amnesia: Secondary | ICD-10-CM | POA: Diagnosis present

## 2016-04-02 LAB — APTT: aPTT: 32 seconds (ref 24–36)

## 2016-04-02 MED ORDER — GADOBENATE DIMEGLUMINE 529 MG/ML IV SOLN
15.0000 mL | Freq: Once | INTRAVENOUS | Status: AC | PRN
  Administered 2016-04-02: 15 mL via INTRAVENOUS

## 2016-04-07 ENCOUNTER — Ambulatory Visit
Admission: RE | Admit: 2016-04-07 | Discharge: 2016-04-07 | Disposition: A | Discharge status: Home / Self Care | Payer: Managed Care, Other (non HMO) | Source: Ambulatory Visit | Attending: Neurology | Admitting: Neurology

## 2016-04-07 DIAGNOSIS — M3219 Other organ or system involvement in systemic lupus erythematosus: Secondary | ICD-10-CM | POA: Diagnosis present

## 2016-04-07 LAB — CBC
HCT: 41.1 % (ref 35.0–47.0)
Hemoglobin: 14.3 g/dL (ref 12.0–16.0)
MCH: 32.5 pg (ref 26.0–34.0)
MCHC: 34.9 g/dL (ref 32.0–36.0)
MCV: 93.1 fL (ref 80.0–100.0)
Platelets: 270 10*3/uL (ref 150–440)
RBC: 4.42 MIL/uL (ref 3.80–5.20)
RDW: 12.9 % (ref 11.5–14.5)
WBC: 7.1 10*3/uL (ref 3.6–11.0)

## 2016-04-07 LAB — CSF CELL COUNT WITH DIFFERENTIAL
Eosinophils, CSF: 0 %
Lymphs, CSF: 70 %
Monocyte-Macrophage-Spinal Fluid: 24 %
RBC Count, CSF: 27 /mm3 — ABNORMAL HIGH (ref 0–3)
Segmented Neutrophils-CSF: 6 %
Tube #: 3
WBC, CSF: 5 /mm3

## 2016-04-07 LAB — GLUCOSE, CSF: Glucose, CSF: 53 mg/dL (ref 40–70)

## 2016-04-07 LAB — APTT: aPTT: 32 seconds (ref 24–36)

## 2016-04-07 LAB — PROTEIN, CSF: Total  Protein, CSF: 47 mg/dL — ABNORMAL HIGH (ref 15–45)

## 2016-04-07 LAB — PROTIME-INR
INR: 0.92
Prothrombin Time: 12.6 seconds (ref 11.4–15.0)

## 2016-04-07 LAB — GLUCOSE, RANDOM: Glucose, Bld: 91 mg/dL (ref 65–99)

## 2016-04-07 MED ORDER — ACETAMINOPHEN 500 MG PO TABS
1000.0000 mg | ORAL_TABLET | Freq: Four times a day (QID) | ORAL | Status: DC | PRN
  Filled 2016-04-07: qty 2

## 2016-04-08 LAB — IGG 4: IgG, Subclass 4: 38 mg/dL (ref 2–96)

## 2016-04-09 LAB — OLIGOCLONAL BANDS, CSF + SERM

## 2016-11-10 DIAGNOSIS — M3501 Sicca syndrome with keratoconjunctivitis: Secondary | ICD-10-CM | POA: Insufficient documentation

## 2017-06-18 DIAGNOSIS — H2511 Age-related nuclear cataract, right eye: Secondary | ICD-10-CM | POA: Diagnosis not present

## 2017-06-18 DIAGNOSIS — H2512 Age-related nuclear cataract, left eye: Secondary | ICD-10-CM | POA: Diagnosis not present

## 2017-06-18 DIAGNOSIS — H25013 Cortical age-related cataract, bilateral: Secondary | ICD-10-CM | POA: Diagnosis not present

## 2017-06-18 DIAGNOSIS — H25812 Combined forms of age-related cataract, left eye: Secondary | ICD-10-CM | POA: Diagnosis not present

## 2017-06-29 ENCOUNTER — Other Ambulatory Visit: Payer: Self-pay | Admitting: Family Medicine

## 2017-06-29 DIAGNOSIS — Z1239 Encounter for other screening for malignant neoplasm of breast: Secondary | ICD-10-CM

## 2017-07-02 DIAGNOSIS — H25811 Combined forms of age-related cataract, right eye: Secondary | ICD-10-CM | POA: Diagnosis not present

## 2017-07-02 DIAGNOSIS — H2511 Age-related nuclear cataract, right eye: Secondary | ICD-10-CM | POA: Diagnosis not present

## 2017-07-27 ENCOUNTER — Ambulatory Visit
Admission: RE | Admit: 2017-07-27 | Discharge: 2017-07-27 | Disposition: A | Discharge status: Home / Self Care | Payer: 59 | Source: Ambulatory Visit | Attending: Family Medicine | Admitting: Family Medicine

## 2017-07-27 DIAGNOSIS — Z1231 Encounter for screening mammogram for malignant neoplasm of breast: Secondary | ICD-10-CM | POA: Insufficient documentation

## 2017-07-27 DIAGNOSIS — Z1239 Encounter for other screening for malignant neoplasm of breast: Secondary | ICD-10-CM

## 2017-08-03 ENCOUNTER — Other Ambulatory Visit: Payer: Self-pay | Admitting: Family Medicine

## 2017-08-03 DIAGNOSIS — N6489 Other specified disorders of breast: Secondary | ICD-10-CM

## 2017-08-03 DIAGNOSIS — R928 Other abnormal and inconclusive findings on diagnostic imaging of breast: Secondary | ICD-10-CM

## 2017-08-05 ENCOUNTER — Ambulatory Visit
Admission: RE | Admit: 2017-08-05 | Discharge: 2017-08-05 | Disposition: A | Discharge status: Home / Self Care | Payer: 59 | Source: Ambulatory Visit | Attending: Family Medicine | Admitting: Family Medicine

## 2017-08-05 DIAGNOSIS — R928 Other abnormal and inconclusive findings on diagnostic imaging of breast: Secondary | ICD-10-CM

## 2017-08-05 DIAGNOSIS — N6489 Other specified disorders of breast: Secondary | ICD-10-CM | POA: Insufficient documentation

## 2017-08-05 DIAGNOSIS — R922 Inconclusive mammogram: Secondary | ICD-10-CM | POA: Diagnosis not present

## 2017-09-10 DIAGNOSIS — H1033 Unspecified acute conjunctivitis, bilateral: Secondary | ICD-10-CM | POA: Diagnosis not present

## 2017-11-09 DIAGNOSIS — I7300 Raynaud's syndrome without gangrene: Secondary | ICD-10-CM | POA: Diagnosis not present

## 2017-11-09 DIAGNOSIS — M329 Systemic lupus erythematosus, unspecified: Secondary | ICD-10-CM | POA: Diagnosis not present

## 2017-11-09 DIAGNOSIS — M3501 Sicca syndrome with keratoconjunctivitis: Secondary | ICD-10-CM | POA: Diagnosis not present

## 2018-02-16 DIAGNOSIS — E039 Hypothyroidism, unspecified: Secondary | ICD-10-CM | POA: Diagnosis not present

## 2018-02-16 DIAGNOSIS — M3501 Sicca syndrome with keratoconjunctivitis: Secondary | ICD-10-CM | POA: Diagnosis not present

## 2018-03-14 DIAGNOSIS — S30861A Insect bite (nonvenomous) of abdominal wall, initial encounter: Secondary | ICD-10-CM | POA: Diagnosis not present

## 2018-03-24 DIAGNOSIS — R197 Diarrhea, unspecified: Secondary | ICD-10-CM | POA: Diagnosis not present

## 2018-03-24 DIAGNOSIS — S30861A Insect bite (nonvenomous) of abdominal wall, initial encounter: Secondary | ICD-10-CM | POA: Diagnosis not present

## 2018-03-24 DIAGNOSIS — R5383 Other fatigue: Secondary | ICD-10-CM | POA: Diagnosis not present

## 2018-03-24 DIAGNOSIS — W57XXXA Bitten or stung by nonvenomous insect and other nonvenomous arthropods, initial encounter: Secondary | ICD-10-CM | POA: Diagnosis not present

## 2018-08-24 DIAGNOSIS — M791 Myalgia, unspecified site: Secondary | ICD-10-CM | POA: Diagnosis not present

## 2018-08-24 DIAGNOSIS — R112 Nausea with vomiting, unspecified: Secondary | ICD-10-CM | POA: Diagnosis not present

## 2018-08-24 DIAGNOSIS — G479 Sleep disorder, unspecified: Secondary | ICD-10-CM | POA: Diagnosis not present

## 2018-08-24 DIAGNOSIS — R5383 Other fatigue: Secondary | ICD-10-CM | POA: Diagnosis not present

## 2018-10-05 DIAGNOSIS — M3501 Sicca syndrome with keratoconjunctivitis: Secondary | ICD-10-CM | POA: Diagnosis not present

## 2018-10-05 DIAGNOSIS — Z0000 Encounter for general adult medical examination without abnormal findings: Secondary | ICD-10-CM | POA: Diagnosis not present

## 2018-10-05 DIAGNOSIS — Z1211 Encounter for screening for malignant neoplasm of colon: Secondary | ICD-10-CM | POA: Diagnosis not present

## 2019-02-17 DIAGNOSIS — R05 Cough: Secondary | ICD-10-CM | POA: Diagnosis not present

## 2019-04-06 ENCOUNTER — Other Ambulatory Visit: Payer: Self-pay | Admitting: Family Medicine

## 2019-04-06 DIAGNOSIS — Z1231 Encounter for screening mammogram for malignant neoplasm of breast: Secondary | ICD-10-CM

## 2019-04-18 ENCOUNTER — Encounter: Payer: Self-pay | Admitting: Maternal Newborn

## 2019-04-18 ENCOUNTER — Ambulatory Visit (INDEPENDENT_AMBULATORY_CARE_PROVIDER_SITE_OTHER): Payer: 59 | Admitting: Maternal Newborn

## 2019-04-18 ENCOUNTER — Other Ambulatory Visit: Payer: Self-pay

## 2019-04-18 ENCOUNTER — Other Ambulatory Visit (HOSPITAL_COMMUNITY)
Admission: RE | Admit: 2019-04-18 | Discharge: 2019-04-18 | Disposition: A | Discharge status: Home / Self Care | Payer: 59 | Source: Ambulatory Visit | Attending: Maternal Newborn | Admitting: Maternal Newborn

## 2019-04-18 VITALS — BP 120/80 | Ht 65.0 in | Wt 147.0 lb

## 2019-04-18 DIAGNOSIS — Z124 Encounter for screening for malignant neoplasm of cervix: Secondary | ICD-10-CM | POA: Insufficient documentation

## 2019-04-18 DIAGNOSIS — Z01419 Encounter for gynecological examination (general) (routine) without abnormal findings: Secondary | ICD-10-CM | POA: Diagnosis not present

## 2019-04-18 NOTE — Progress Notes (Signed)
Gynecology Annual Exam  PCP: Dorothey BasemanBronstein, David, MD  Chief Complaint:  Chief Complaint  Patient presents with  . Gynecologic Exam    History of Present Illness:Patient is a 63 y.o. G1P1001 presenting for an annual exam.   LMP: No LMP recorded. Patient is postmenopausal. Postmenopausal Bleeding: no  The patient is not currently sexually active. The patient does perform self breast exams.  There is no notable family history of breast or ovarian cancer in her family.  The patient wears seatbelts: yes.   The patient has regular exercise: No, however, she is active and walks her dogs.    The patient does not have current symptoms of depression.     Review of Systems  Constitutional: Positive for malaise/fatigue.  HENT: Negative.   Eyes: Positive for pain and redness.  Respiratory: Positive for cough. Negative for shortness of breath and wheezing.        Related to allergies  Gastrointestinal: Negative.   Genitourinary: Negative.   Musculoskeletal: Positive for joint pain.  Skin: Negative.   Neurological: Negative.   Endo/Heme/Allergies: Positive for environmental allergies.       Intolerance to heat  Psychiatric/Behavioral: Negative.     Past Medical History:  Past Medical History:  Diagnosis Date  . Anxiety   . Depression   . High cholesterol   . Hypothyroidism   . Lupus (HCC)   . Migraine   . OSA (obstructive sleep apnea) 02/08/2013    Past Surgical History:  History reviewed. No pertinent surgical history.  Gynecologic History:  No LMP recorded. Patient is postmenopausal. Last Pap: Several years ago, she reports that she has had normal Paps.  Last mammogram: 07/27/2017. Results were: BI-RAD 0 (incomplete), no record of follow up Obstetric History: G1P1001  Family History:  Family History  Problem Relation Age of Onset  . Lung cancer Maternal Grandfather     Social History:  Social History   Socioeconomic History  . Marital status: Married    Spouse  name: Not on file  . Number of children: 1  . Years of education: college  . Highest education level: Not on file  Occupational History  . 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 Financial resource strain: Not on file  . Food insecurity    Worry: Not on file    Inability: Not on file  . Transportation needs    Medical: Not on file    Non-medical: Not on file  Tobacco Use  . Smoking status: Current Every Day Smoker    Packs/day: 0.50  . Smokeless tobacco: Never Used  Substance and Sexual Activity  . Alcohol use: Never    Frequency: Never  . Drug use: Never  . Sexual activity: Not Currently  Lifestyle  . Physical activity    Days per week: Not on file    Minutes per session: Not on file  . Stress: Not on file  Relationships  . Social Musicianconnections    Talks on phone: Not on file    Gets together: Not on file    Attends religious service: Not on file    Active member of club or organization: Not on file    Attends meetings of clubs or organizations: Not on file    Relationship status: Not on file  . Intimate partner violence    Fear of current or ex partner: Not on file    Emotionally abused: Not on file    Physically  abused: Not on file    Forced sexual activity: Not on file  Other Topics Concern  . Not on file  Social History Narrative  . Not on file    Allergies:  Allergies  Allergen Reactions  . Naprosyn [Naproxen]     Medications: Prior to Admission medications   Medication Sig Start Date End Date Taking? Authorizing Provider  DULoxetine (CYMBALTA) 60 MG capsule  01/10/13  Yes [provider]  hydroxychloroquine (PLAQUENIL) 200 MG tablet  01/16/13  Yes [provider]  levothyroxine (SYNTHROID, LEVOTHROID) 112 MCG tablet Take 100 mcg by mouth.  01/13/13  Yes [provider]  LOTEMAX 0.5 % ophthalmic suspension  12/31/12  Yes [provider]  vitamin B-12 (CYANOCOBALAMIN) 1000 MCG tablet Take  1,000 mcg by mouth daily.   Yes [provider]  Vitamin D, Ergocalciferol, (DRISDOL) 50000 UNITS CAPS Take 2,000 Units by mouth daily.  11/01/12  Yes [provider]  aspirin 81 MG tablet Take 81 mg by mouth daily.    [provider]  Phosphatidylserine-DHA-EPA (VAYACOG) 100-19.5-6.5 MG CAPS Take 1 tablet by mouth.    [provider]  pramipexole (MIRAPEX) 0.125 MG tablet 1 pill each night for 1 week, the 2 pills each night for 1 week, then 3 pills each night thereafter Patient not taking: Reported on 04/18/2019 06/14/13   Huston FoleyAthar, Saima, MD  prednisoLONE 5 MG TABS tablet Take 5 mg by mouth daily.    [provider]  sertraline (ZOLOFT) 50 MG tablet  04/07/19   [provider]    Physical Exam Vitals: Blood pressure 120/80, height 5\' 5"  (1.651 m), weight 147 lb (66.7 kg).  General: NAD HEENT: normocephalic, anicteric Thyroid: no enlargement Pulmonary: No increased work of breathing, CTAB Cardiovascular: RRR, no Breast: Breasts symmetrical, no tenderness, no palpable nodules or masses, no skin or nipple retraction present, no nipple discharge.  No axillary or supraclavicular lymphadenopathy. Small nodule visible and palpable on L chest wall at sternal border, not part of breast tissue Abdomen: Soft, non-tender, non-distended.  Umbilicus without lesions.  No hepatomegaly, splenomegaly or masses palpable. No evidence of hernia  Genitourinary:  External: Normal external female genitalia.  Normal urethral  meatus, normal Bartholin's and Skene's glands.    Vagina: Normal vaginal mucosa, no evidence of prolapse.    Cervix: Grossly normal in appearance, no bleeding  Uterus: Non-enlarged, mobile, normal contour.  No CMT  Adnexa: ovaries non-enlarged, no adnexal masses  Rectal: deferred  Lymphatic: no evidence of inguinal lymphadenopathy Extremities: no edema, erythema, or tenderness Neurologic: Grossly intact Psychiatric: mood appropriate, affect  full   Assessment: 63 y.o. G1P1001 here for an annual exam  Plan: Problem List Items Addressed This Visit    None    Visit Diagnoses    Encounter for annual routine gynecological examination    -  Primary   Pap smear for cervical cancer screening       Relevant Orders   Cytology - PAP      1) Mammogram - recommend yearly screening mammogram.  Mammogram has been ordered by her PCP and is scheduled for 05/16/2019  2) STI screening was offered and declined  3) ASCCP guidelines and rationale discussed.  Patient opts for every 3 year screening interval  4) Osteoporosis  - per USPTF routine screening DEXA at age 64  5) Routine healthcare maintenance including cholesterol, diabetes screening: managed by PCP  6) Colonoscopy: last done in 2013, has appointment with GI to have a repeat colonoscopy  7) Follow up 1 year for an annual exam.  Avel Sensor, CNM 04/18/2019  8:46 AM

## 2019-04-18 NOTE — Patient Instructions (Signed)
Preventive Care 40-64 Years, Female Preventive care refers to lifestyle choices and visits with your health care provider that can promote health and wellness. What does preventive care include?   A yearly physical exam. This is also called an annual well check.  Dental exams once or twice a year.  Routine eye exams. Ask your health care provider how often you should have your eyes checked.  Personal lifestyle choices, including: ? Daily care of your teeth and gums. ? Regular physical activity. ? Eating a healthy diet. ? Avoiding tobacco and drug use. ? Limiting alcohol use. ? Practicing safe sex. ? Taking low-dose aspirin daily starting at age 50. ? Taking vitamin and mineral supplements as recommended by your health care provider. What happens during an annual well check? The services and screenings done by your health care provider during your annual well check will depend on your age, overall health, lifestyle risk factors, and family history of disease. Counseling Your health care provider may ask you questions about your:  Alcohol use.  Tobacco use.  Drug use.  Emotional well-being.  Home and relationship well-being.  Sexual activity.  Eating habits.  Work and work environment.  Method of birth control.  Menstrual cycle.  Pregnancy history. Screening You may have the following tests or measurements:  Height, weight, and BMI.  Blood pressure.  Lipid and cholesterol levels. These may be checked every 5 years, or more frequently if you are over 50 years old.  Skin check.  Lung cancer screening. You may have this screening every year starting at age 55 if you have a 30-pack-year history of smoking and currently smoke or have quit within the past 15 years.  Colorectal cancer screening. All adults should have this screening starting at age 50 and continuing until age 75. Your health care provider may recommend screening at age 45. You will have tests every  1-10 years, depending on your results and the type of screening test. People at increased risk should start screening at an earlier age. Screening tests may include: ? Guaiac-based fecal occult blood testing. ? Fecal immunochemical test (FIT). ? Stool DNA test. ? Virtual colonoscopy. ? Sigmoidoscopy. During this test, a flexible tube with a tiny camera (sigmoidoscope) is used to examine your rectum and lower colon. The sigmoidoscope is inserted through your anus into your rectum and lower colon. ? Colonoscopy. During this test, a long, thin, flexible tube with a tiny camera (colonoscope) is used to examine your entire colon and rectum.  Hepatitis C blood test.  Hepatitis B blood test.  Sexually transmitted disease (STD) testing.  Diabetes screening. This is done by checking your blood sugar (glucose) after you have not eaten for a while (fasting). You may have this done every 1-3 years.  Mammogram. This may be done every 1-2 years. Talk to your health care provider about when you should start having regular mammograms. This may depend on whether you have a family history of breast cancer.  BRCA-related cancer screening. This may be done if you have a family history of breast, ovarian, tubal, or peritoneal cancers.  Pelvic exam and Pap test. This may be done every 3 years starting at age 21. Starting at age 30, this may be done every 5 years if you have a Pap test in combination with an HPV test.  Bone density scan. This is done to screen for osteoporosis. You may have this scan if you are at high risk for osteoporosis. Discuss your test results, treatment options,   and if necessary, the need for more tests with your health care provider. Vaccines Your health care provider may recommend certain vaccines, such as:  Influenza vaccine. This is recommended every year.  Tetanus, diphtheria, and acellular pertussis (Tdap, Td) vaccine. You may need a Td booster every 10 years.  Varicella  vaccine. You may need this if you have not been vaccinated.  Zoster vaccine. You may need this after age 38.  Measles, mumps, and rubella (MMR) vaccine. You may need at least one dose of MMR if you were born in 1957 or later. You may also need a second dose.  Pneumococcal 13-valent conjugate (PCV13) vaccine. You may need this if you have certain conditions and were not previously vaccinated.  Pneumococcal polysaccharide (PPSV23) vaccine. You may need one or two doses if you smoke cigarettes or if you have certain conditions.  Meningococcal vaccine. You may need this if you have certain conditions.  Hepatitis A vaccine. You may need this if you have certain conditions or if you travel or work in places where you may be exposed to hepatitis A.  Hepatitis B vaccine. You may need this if you have certain conditions or if you travel or work in places where you may be exposed to hepatitis B.  Haemophilus influenzae type b (Hib) vaccine. You may need this if you have certain conditions. Talk to your health care provider about which screenings and vaccines you need and how often you need them. This information is not intended to replace advice given to you by your health care provider. Make sure you discuss any questions you have with your health care provider. Document Released: 11/15/2015 Document Revised: 12/09/2017 Document Reviewed: 08/20/2015 Elsevier Interactive Patient Education  2019 [employer].

## 2019-04-20 LAB — CYTOLOGY - PAP
Diagnosis: NEGATIVE
HPV: NOT DETECTED

## 2019-05-16 ENCOUNTER — Other Ambulatory Visit: Payer: Self-pay

## 2019-05-16 ENCOUNTER — Ambulatory Visit
Admission: RE | Admit: 2019-05-16 | Discharge: 2019-05-16 | Disposition: A | Discharge status: Home / Self Care | Payer: 59 | Source: Ambulatory Visit | Attending: Family Medicine | Admitting: Family Medicine

## 2019-05-16 DIAGNOSIS — Z1231 Encounter for screening mammogram for malignant neoplasm of breast: Secondary | ICD-10-CM | POA: Insufficient documentation

## 2019-07-11 IMAGING — MG MM DIGITAL DIAGNOSTIC UNILAT*R* W/ TOMO W/ CAD
7 series · 9 of 15 positions shown · non-contrast
Comparison: Previous exam(s).

CLINICAL DATA: Possible asymmetry in the superior, posterior aspect
of the right breast on the oblique view of a recent 2D screening
mammogram.

EXAM:
2D DIGITAL DIAGNOSTIC UNILATERAL RIGHT MAMMOGRAM WITH CAD AND
ADJUNCT TOMO

[R XCCL]
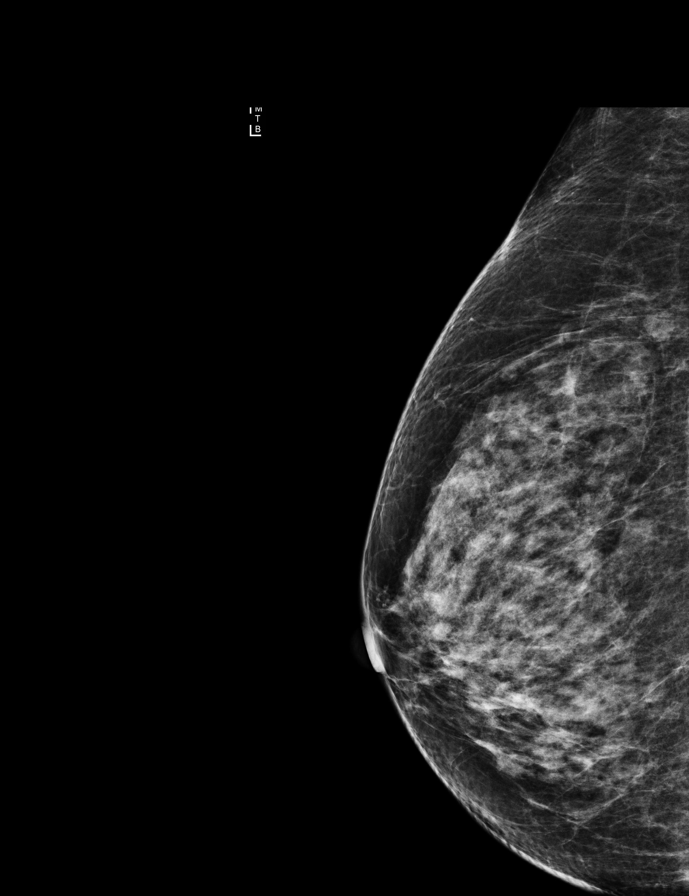

[R CC]
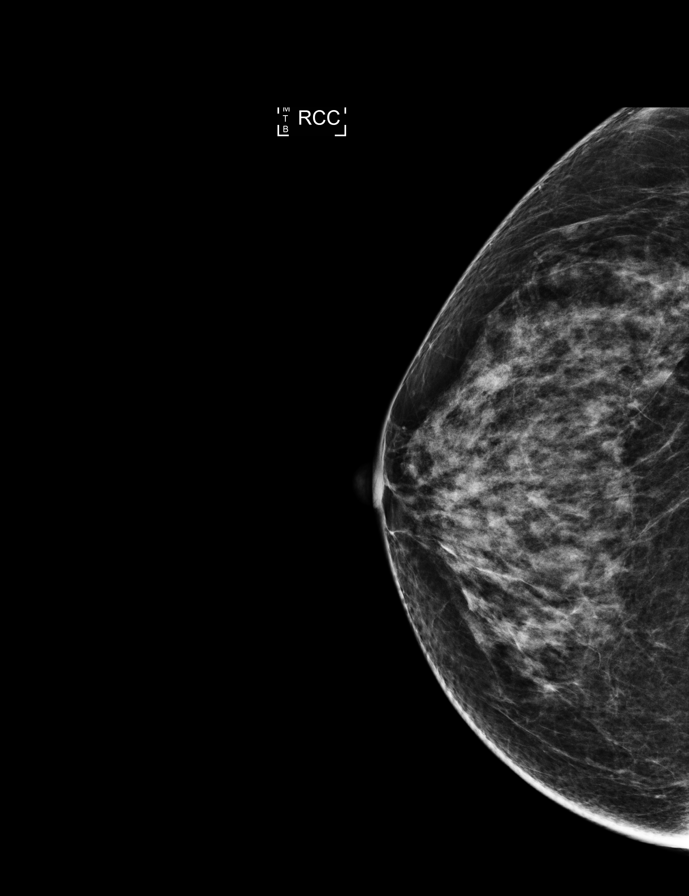

[R CC synth-2D]
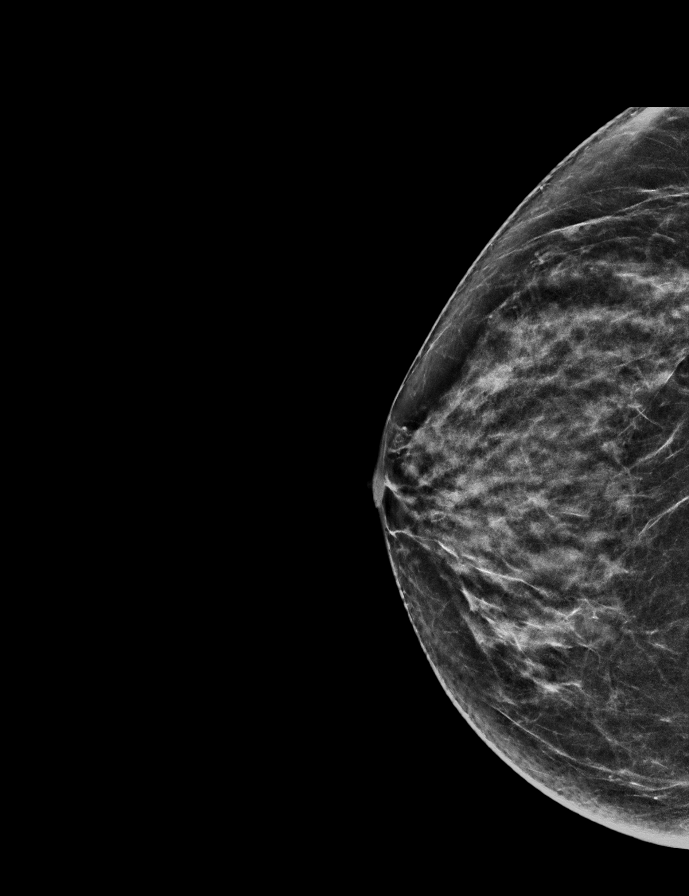

[R MLO]
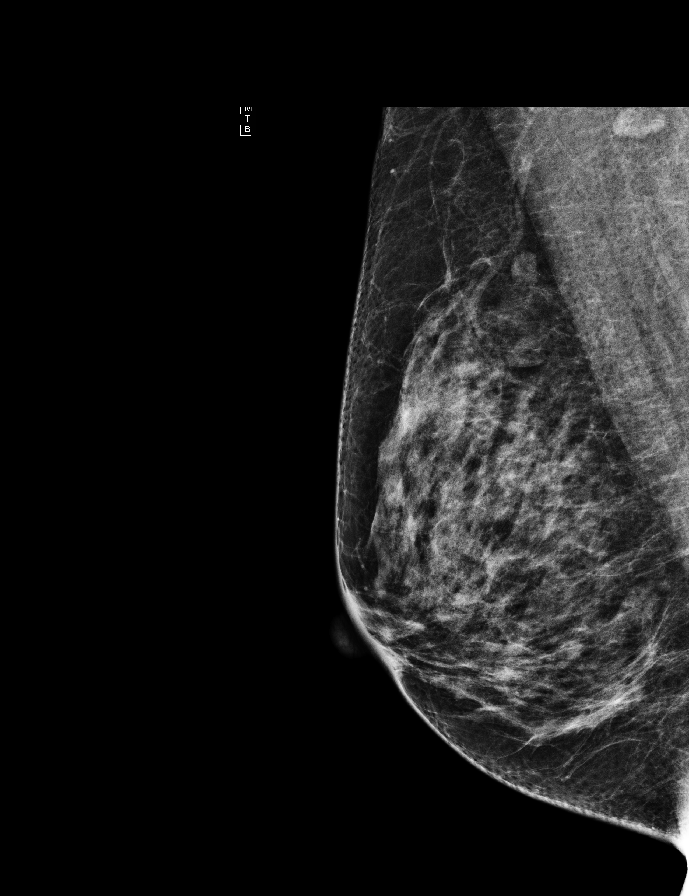

[R MLO synth-2D]
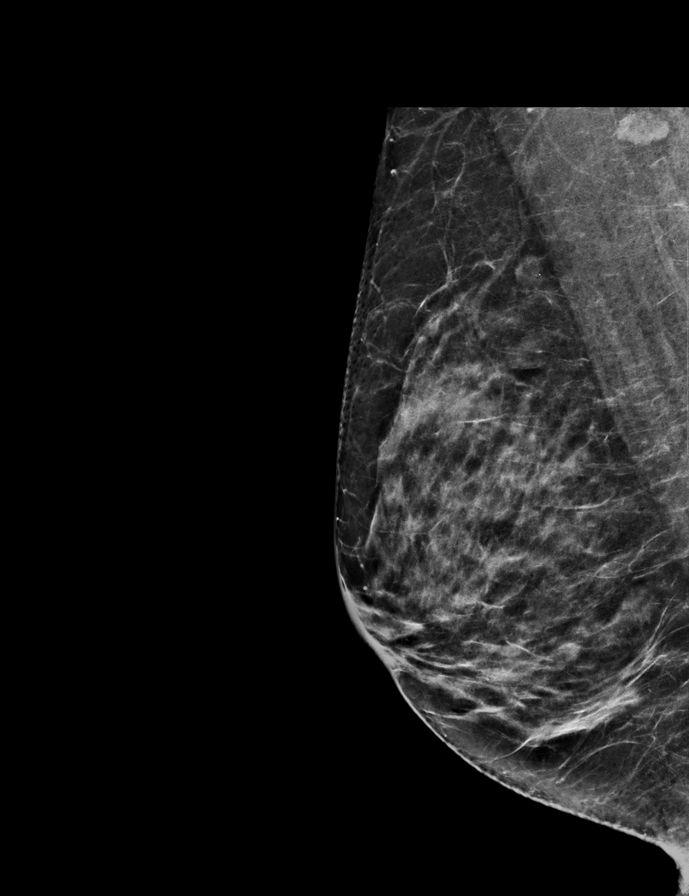

[R MLO tomo · 3 of 62 frames shown]
[frame 21/62]
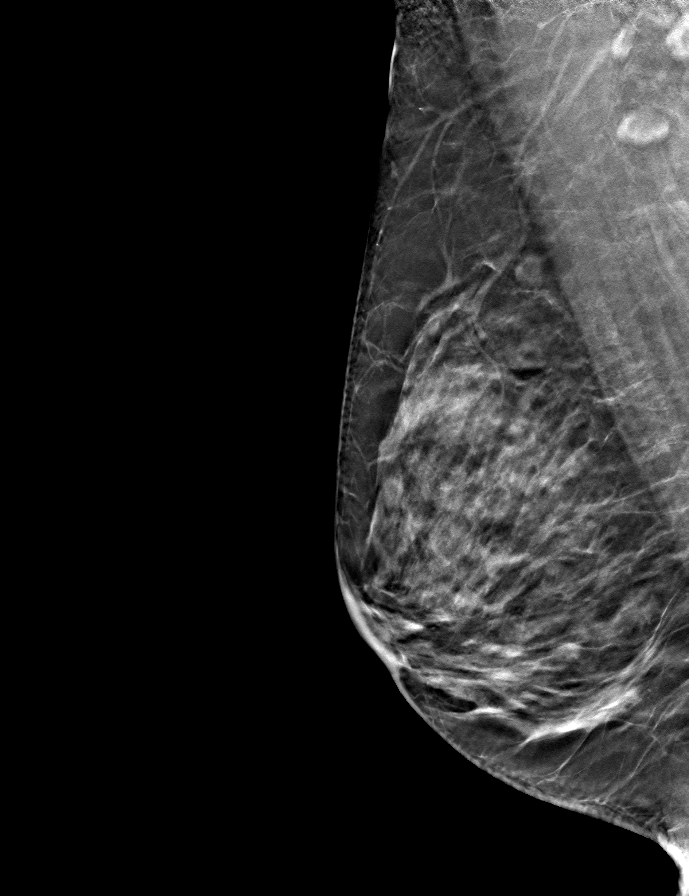
[frame 31/62]
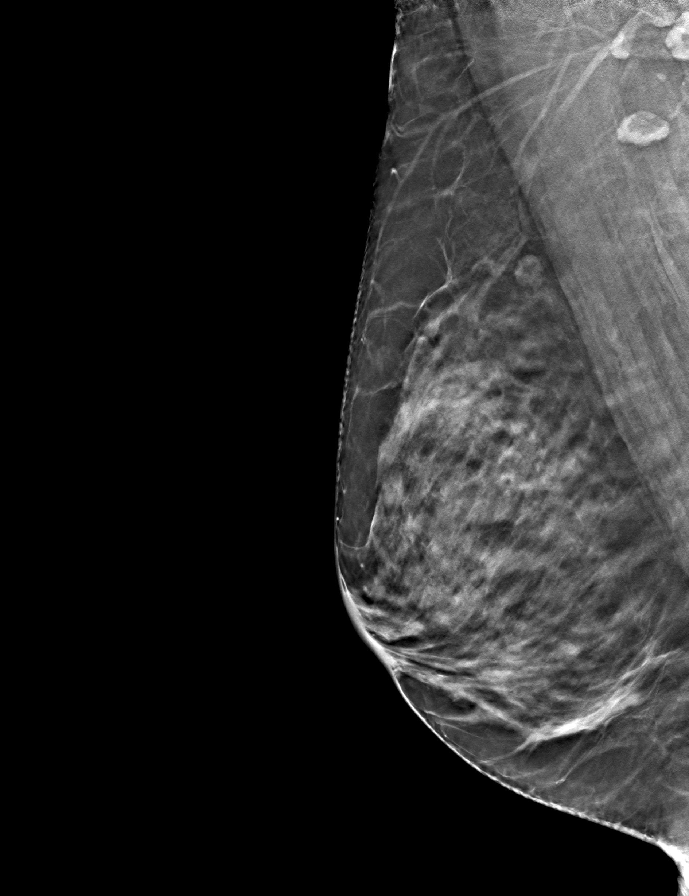
[frame 42/62]
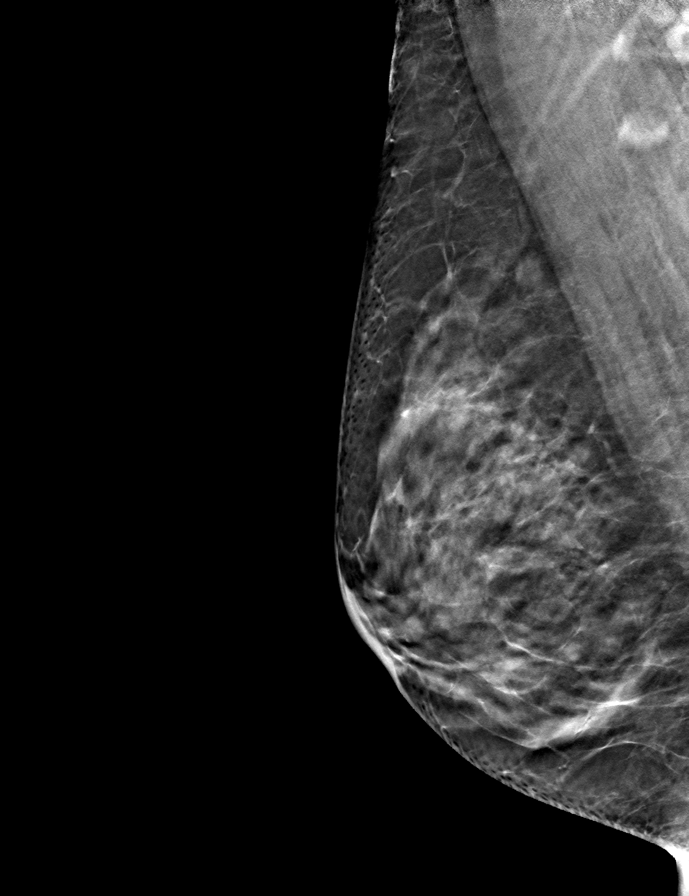

[R CC tomo · tomo slice 29/57.0]
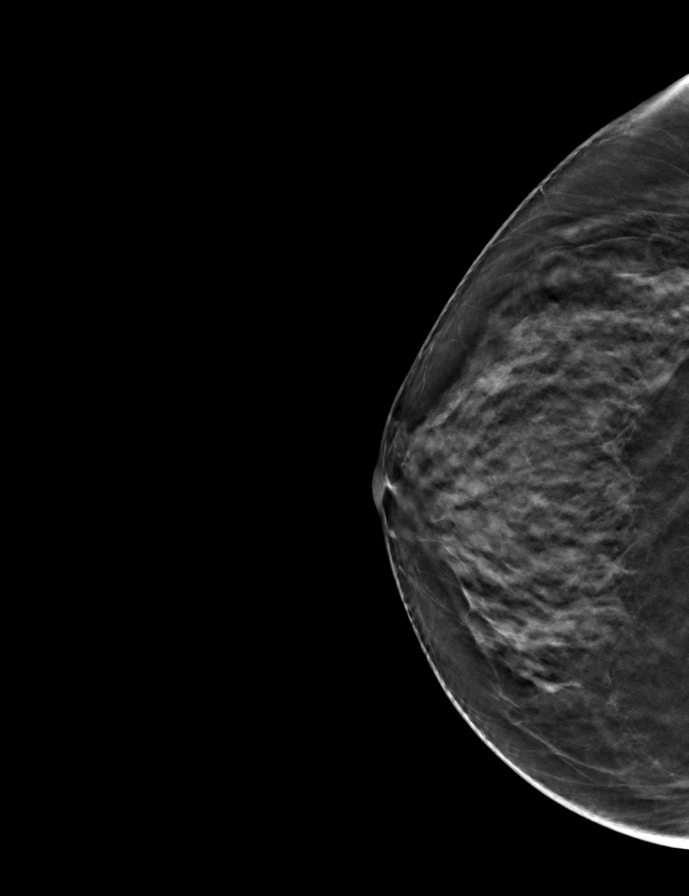

[9 of 15 positions shown; findings below may reference images not displayed]

ACR Breast Density Category c: The breast tissue is heterogeneously
dense, which may obscure small masses.
FINDINGS: 2D and 3D tomographic images of the right breast demonstrate normal
appearing breast tissue at the location of the recently suspected
asymmetry in the superior, posterior aspect of the right breast.
There are no findings suspicious for malignancy.

Mammographic images were processed with CAD.
IMPRESSION: No evidence of malignancy. The recently suspected right breast
asymmetry was overlapping of normal breast tissue.

RECOMMENDATION:
Bilateral screening mammogram in 1 year.

I have discussed the findings and recommendations with the patient.
Results were also provided in writing at the conclusion of the
visit. If applicable, a reminder letter will be sent to the patient
regarding the next appointment.

BI-RADS CATEGORY  1: Negative.

## 2021-01-23 DIAGNOSIS — E782 Mixed hyperlipidemia: Secondary | ICD-10-CM | POA: Diagnosis not present

## 2021-01-23 DIAGNOSIS — E039 Hypothyroidism, unspecified: Secondary | ICD-10-CM | POA: Diagnosis not present

## 2021-01-23 DIAGNOSIS — M3501 Sicca syndrome with keratoconjunctivitis: Secondary | ICD-10-CM | POA: Diagnosis not present

## 2021-03-19 DIAGNOSIS — F330 Major depressive disorder, recurrent, mild: Secondary | ICD-10-CM | POA: Diagnosis not present

## 2021-03-19 DIAGNOSIS — F411 Generalized anxiety disorder: Secondary | ICD-10-CM | POA: Diagnosis not present

## 2021-05-27 DIAGNOSIS — H5789 Other specified disorders of eye and adnexa: Secondary | ICD-10-CM | POA: Diagnosis not present

## 2021-05-29 DIAGNOSIS — Z79899 Other long term (current) drug therapy: Secondary | ICD-10-CM | POA: Diagnosis not present

## 2021-05-29 DIAGNOSIS — M3501 Sicca syndrome with keratoconjunctivitis: Secondary | ICD-10-CM | POA: Diagnosis not present

## 2021-05-29 DIAGNOSIS — M329 Systemic lupus erythematosus, unspecified: Secondary | ICD-10-CM | POA: Diagnosis not present

## 2021-05-29 DIAGNOSIS — Z0100 Encounter for examination of eyes and vision without abnormal findings: Secondary | ICD-10-CM | POA: Diagnosis not present

## 2021-06-24 DIAGNOSIS — Z23 Encounter for immunization: Secondary | ICD-10-CM | POA: Diagnosis not present

## 2021-06-24 DIAGNOSIS — Z0000 Encounter for general adult medical examination without abnormal findings: Secondary | ICD-10-CM | POA: Diagnosis not present

## 2021-06-24 DIAGNOSIS — G4733 Obstructive sleep apnea (adult) (pediatric): Secondary | ICD-10-CM | POA: Diagnosis not present

## 2021-06-24 DIAGNOSIS — F419 Anxiety disorder, unspecified: Secondary | ICD-10-CM | POA: Diagnosis not present

## 2021-06-24 DIAGNOSIS — Z1331 Encounter for screening for depression: Secondary | ICD-10-CM | POA: Diagnosis not present

## 2021-06-24 DIAGNOSIS — M3500 Sicca syndrome, unspecified: Secondary | ICD-10-CM | POA: Diagnosis not present

## 2021-06-24 DIAGNOSIS — E039 Hypothyroidism, unspecified: Secondary | ICD-10-CM | POA: Diagnosis not present

## 2021-06-24 DIAGNOSIS — F32A Depression, unspecified: Secondary | ICD-10-CM | POA: Diagnosis not present

## 2021-06-24 DIAGNOSIS — Z1231 Encounter for screening mammogram for malignant neoplasm of breast: Secondary | ICD-10-CM | POA: Diagnosis not present

## 2021-08-11 ENCOUNTER — Other Ambulatory Visit: Payer: Self-pay | Admitting: *Deleted

## 2021-08-11 DIAGNOSIS — Z87891 Personal history of nicotine dependence: Secondary | ICD-10-CM

## 2021-08-11 DIAGNOSIS — F17210 Nicotine dependence, cigarettes, uncomplicated: Secondary | ICD-10-CM

## 2021-08-25 ENCOUNTER — Encounter: Payer: 59 | Admitting: Acute Care

## 2021-08-27 ENCOUNTER — Ambulatory Visit: Payer: 59

## 2021-11-13 DIAGNOSIS — F330 Major depressive disorder, recurrent, mild: Secondary | ICD-10-CM | POA: Diagnosis not present

## 2021-11-13 DIAGNOSIS — F411 Generalized anxiety disorder: Secondary | ICD-10-CM | POA: Diagnosis not present

## 2021-11-13 DIAGNOSIS — R41840 Attention and concentration deficit: Secondary | ICD-10-CM | POA: Diagnosis not present

## 2021-12-11 DIAGNOSIS — F411 Generalized anxiety disorder: Secondary | ICD-10-CM | POA: Diagnosis not present

## 2021-12-11 DIAGNOSIS — F330 Major depressive disorder, recurrent, mild: Secondary | ICD-10-CM | POA: Diagnosis not present

## 2021-12-11 DIAGNOSIS — R41840 Attention and concentration deficit: Secondary | ICD-10-CM | POA: Diagnosis not present

## 2022-03-05 DIAGNOSIS — F411 Generalized anxiety disorder: Secondary | ICD-10-CM | POA: Diagnosis not present

## 2022-03-05 DIAGNOSIS — F330 Major depressive disorder, recurrent, mild: Secondary | ICD-10-CM | POA: Diagnosis not present

## 2022-03-05 DIAGNOSIS — R41840 Attention and concentration deficit: Secondary | ICD-10-CM | POA: Diagnosis not present

## 2022-03-20 DIAGNOSIS — F330 Major depressive disorder, recurrent, mild: Secondary | ICD-10-CM | POA: Diagnosis not present

## 2022-03-20 DIAGNOSIS — F411 Generalized anxiety disorder: Secondary | ICD-10-CM | POA: Diagnosis not present

## 2022-06-04 DIAGNOSIS — R41840 Attention and concentration deficit: Secondary | ICD-10-CM | POA: Diagnosis not present

## 2022-06-04 DIAGNOSIS — F330 Major depressive disorder, recurrent, mild: Secondary | ICD-10-CM | POA: Diagnosis not present

## 2022-06-04 DIAGNOSIS — F411 Generalized anxiety disorder: Secondary | ICD-10-CM | POA: Diagnosis not present

## 2022-06-22 DIAGNOSIS — Z0000 Encounter for general adult medical examination without abnormal findings: Secondary | ICD-10-CM | POA: Diagnosis not present

## 2022-06-22 DIAGNOSIS — E039 Hypothyroidism, unspecified: Secondary | ICD-10-CM | POA: Diagnosis not present

## 2022-06-22 DIAGNOSIS — Z13220 Encounter for screening for lipoid disorders: Secondary | ICD-10-CM | POA: Diagnosis not present

## 2022-06-26 DIAGNOSIS — M3500 Sicca syndrome, unspecified: Secondary | ICD-10-CM | POA: Diagnosis not present

## 2022-06-26 DIAGNOSIS — Z1389 Encounter for screening for other disorder: Secondary | ICD-10-CM | POA: Diagnosis not present

## 2022-06-26 DIAGNOSIS — Z23 Encounter for immunization: Secondary | ICD-10-CM | POA: Diagnosis not present

## 2022-06-26 DIAGNOSIS — Z0000 Encounter for general adult medical examination without abnormal findings: Secondary | ICD-10-CM | POA: Diagnosis not present

## 2022-06-26 DIAGNOSIS — M329 Systemic lupus erythematosus, unspecified: Secondary | ICD-10-CM | POA: Diagnosis not present

## 2022-09-08 DIAGNOSIS — R41840 Attention and concentration deficit: Secondary | ICD-10-CM | POA: Diagnosis not present

## 2022-09-08 DIAGNOSIS — F411 Generalized anxiety disorder: Secondary | ICD-10-CM | POA: Diagnosis not present

## 2022-09-08 DIAGNOSIS — F330 Major depressive disorder, recurrent, mild: Secondary | ICD-10-CM | POA: Diagnosis not present

## 2022-10-15 ENCOUNTER — Encounter: Payer: Self-pay | Admitting: *Deleted

## 2022-12-29 DIAGNOSIS — G4733 Obstructive sleep apnea (adult) (pediatric): Secondary | ICD-10-CM | POA: Diagnosis not present

## 2022-12-29 DIAGNOSIS — F32A Depression, unspecified: Secondary | ICD-10-CM | POA: Diagnosis not present

## 2022-12-29 DIAGNOSIS — M3501 Sicca syndrome with keratoconjunctivitis: Secondary | ICD-10-CM | POA: Diagnosis not present

## 2022-12-29 DIAGNOSIS — M329 Systemic lupus erythematosus, unspecified: Secondary | ICD-10-CM | POA: Diagnosis not present

## 2022-12-29 DIAGNOSIS — E039 Hypothyroidism, unspecified: Secondary | ICD-10-CM | POA: Diagnosis not present

## 2023-01-25 DIAGNOSIS — F330 Major depressive disorder, recurrent, mild: Secondary | ICD-10-CM | POA: Diagnosis not present

## 2023-01-25 DIAGNOSIS — F411 Generalized anxiety disorder: Secondary | ICD-10-CM | POA: Diagnosis not present

## 2023-01-25 DIAGNOSIS — R41840 Attention and concentration deficit: Secondary | ICD-10-CM | POA: Diagnosis not present

## 2023-02-11 DIAGNOSIS — R1013 Epigastric pain: Secondary | ICD-10-CM | POA: Diagnosis not present

## 2023-02-11 DIAGNOSIS — R42 Dizziness and giddiness: Secondary | ICD-10-CM | POA: Diagnosis not present

## 2023-02-11 DIAGNOSIS — R0789 Other chest pain: Secondary | ICD-10-CM | POA: Diagnosis not present

## 2023-02-11 DIAGNOSIS — R634 Abnormal weight loss: Secondary | ICD-10-CM | POA: Diagnosis not present

## 2023-02-11 DIAGNOSIS — R109 Unspecified abdominal pain: Secondary | ICD-10-CM | POA: Diagnosis not present

## 2023-02-11 DIAGNOSIS — R5383 Other fatigue: Secondary | ICD-10-CM | POA: Diagnosis not present

## 2023-02-23 DIAGNOSIS — Z83719 Family history of colon polyps, unspecified: Secondary | ICD-10-CM | POA: Diagnosis not present

## 2023-02-23 DIAGNOSIS — R1310 Dysphagia, unspecified: Secondary | ICD-10-CM | POA: Diagnosis not present

## 2023-02-23 DIAGNOSIS — R634 Abnormal weight loss: Secondary | ICD-10-CM | POA: Diagnosis not present

## 2023-02-23 DIAGNOSIS — R110 Nausea: Secondary | ICD-10-CM | POA: Diagnosis not present

## 2023-02-23 DIAGNOSIS — Z789 Other specified health status: Secondary | ICD-10-CM | POA: Diagnosis not present

## 2023-02-23 DIAGNOSIS — R1013 Epigastric pain: Secondary | ICD-10-CM | POA: Diagnosis not present

## 2023-02-23 DIAGNOSIS — K588 Other irritable bowel syndrome: Secondary | ICD-10-CM | POA: Diagnosis not present

## 2023-02-23 DIAGNOSIS — F411 Generalized anxiety disorder: Secondary | ICD-10-CM | POA: Diagnosis not present

## 2023-02-23 DIAGNOSIS — F32A Depression, unspecified: Secondary | ICD-10-CM | POA: Diagnosis not present

## 2023-02-24 DIAGNOSIS — R1013 Epigastric pain: Secondary | ICD-10-CM | POA: Diagnosis not present

## 2023-02-24 DIAGNOSIS — Z1159 Encounter for screening for other viral diseases: Secondary | ICD-10-CM | POA: Diagnosis not present

## 2023-02-24 DIAGNOSIS — R634 Abnormal weight loss: Secondary | ICD-10-CM | POA: Diagnosis not present

## 2023-02-24 DIAGNOSIS — Z79899 Other long term (current) drug therapy: Secondary | ICD-10-CM | POA: Diagnosis not present

## 2023-02-26 ENCOUNTER — Other Ambulatory Visit: Payer: Self-pay | Admitting: Gastroenterology

## 2023-02-26 DIAGNOSIS — R634 Abnormal weight loss: Secondary | ICD-10-CM

## 2023-03-03 ENCOUNTER — Encounter: Admission: RE | Payer: Self-pay | Source: Home / Self Care

## 2023-03-03 ENCOUNTER — Ambulatory Visit: Payer: Medicare HMO

## 2023-03-03 ENCOUNTER — Ambulatory Visit: Admission: RE | Admit: 2023-03-03 | Payer: Self-pay | Source: Home / Self Care | Admitting: Gastroenterology

## 2023-03-03 DIAGNOSIS — K2289 Other specified disease of esophagus: Secondary | ICD-10-CM | POA: Diagnosis not present

## 2023-03-03 DIAGNOSIS — K224 Dyskinesia of esophagus: Secondary | ICD-10-CM | POA: Diagnosis not present

## 2023-03-03 DIAGNOSIS — K2950 Unspecified chronic gastritis without bleeding: Secondary | ICD-10-CM | POA: Diagnosis not present

## 2023-03-03 DIAGNOSIS — K317 Polyp of stomach and duodenum: Secondary | ICD-10-CM | POA: Diagnosis not present

## 2023-03-03 DIAGNOSIS — K449 Diaphragmatic hernia without obstruction or gangrene: Secondary | ICD-10-CM | POA: Diagnosis not present

## 2023-03-03 SURGERY — EGD (ESOPHAGOGASTRODUODENOSCOPY)
Anesthesia: General

## 2023-03-11 ENCOUNTER — Ambulatory Visit
Admission: RE | Admit: 2023-03-11 | Discharge: 2023-03-11 | Disposition: A | Discharge status: Home / Self Care | Payer: Medicare HMO | Source: Ambulatory Visit | Attending: Gastroenterology | Admitting: Gastroenterology

## 2023-03-11 DIAGNOSIS — I700 Atherosclerosis of aorta: Secondary | ICD-10-CM | POA: Diagnosis not present

## 2023-03-11 DIAGNOSIS — R5383 Other fatigue: Secondary | ICD-10-CM | POA: Diagnosis not present

## 2023-03-11 DIAGNOSIS — R634 Abnormal weight loss: Secondary | ICD-10-CM | POA: Diagnosis not present

## 2023-03-11 MED ORDER — IOPAMIDOL (ISOVUE-300) INJECTION 61%
100.0000 mL | Freq: Once | INTRAVENOUS | Status: AC | PRN
  Administered 2023-03-11: 100 mL via INTRAVENOUS

## 2023-06-15 DIAGNOSIS — F411 Generalized anxiety disorder: Secondary | ICD-10-CM | POA: Diagnosis not present

## 2023-06-15 DIAGNOSIS — F330 Major depressive disorder, recurrent, mild: Secondary | ICD-10-CM | POA: Diagnosis not present

## 2023-06-15 DIAGNOSIS — R41840 Attention and concentration deficit: Secondary | ICD-10-CM | POA: Diagnosis not present

## 2023-07-08 DIAGNOSIS — F411 Generalized anxiety disorder: Secondary | ICD-10-CM | POA: Diagnosis not present

## 2023-07-08 DIAGNOSIS — R41840 Attention and concentration deficit: Secondary | ICD-10-CM | POA: Diagnosis not present

## 2023-07-08 DIAGNOSIS — F330 Major depressive disorder, recurrent, mild: Secondary | ICD-10-CM | POA: Diagnosis not present

## 2023-07-23 DIAGNOSIS — M3500 Sicca syndrome, unspecified: Secondary | ICD-10-CM | POA: Diagnosis not present

## 2023-07-23 DIAGNOSIS — Z0000 Encounter for general adult medical examination without abnormal findings: Secondary | ICD-10-CM | POA: Diagnosis not present

## 2023-07-23 DIAGNOSIS — F32A Depression, unspecified: Secondary | ICD-10-CM | POA: Diagnosis not present

## 2023-07-23 DIAGNOSIS — G4730 Sleep apnea, unspecified: Secondary | ICD-10-CM | POA: Diagnosis not present

## 2023-07-23 DIAGNOSIS — E039 Hypothyroidism, unspecified: Secondary | ICD-10-CM | POA: Diagnosis not present

## 2023-07-23 DIAGNOSIS — Z1231 Encounter for screening mammogram for malignant neoplasm of breast: Secondary | ICD-10-CM | POA: Diagnosis not present

## 2023-08-05 DIAGNOSIS — M3501 Sicca syndrome with keratoconjunctivitis: Secondary | ICD-10-CM | POA: Diagnosis not present

## 2023-08-05 DIAGNOSIS — E782 Mixed hyperlipidemia: Secondary | ICD-10-CM | POA: Diagnosis not present

## 2023-08-05 DIAGNOSIS — E039 Hypothyroidism, unspecified: Secondary | ICD-10-CM | POA: Diagnosis not present

## 2024-01-26 DIAGNOSIS — E039 Hypothyroidism, unspecified: Secondary | ICD-10-CM | POA: Diagnosis not present

## 2024-01-26 DIAGNOSIS — G4733 Obstructive sleep apnea (adult) (pediatric): Secondary | ICD-10-CM | POA: Diagnosis not present

## 2024-01-26 DIAGNOSIS — M3501 Sicca syndrome with keratoconjunctivitis: Secondary | ICD-10-CM | POA: Diagnosis not present

## 2024-01-26 DIAGNOSIS — F32A Depression, unspecified: Secondary | ICD-10-CM | POA: Diagnosis not present

## 2024-02-10 DIAGNOSIS — M3501 Sicca syndrome with keratoconjunctivitis: Secondary | ICD-10-CM | POA: Diagnosis not present

## 2024-02-10 DIAGNOSIS — E039 Hypothyroidism, unspecified: Secondary | ICD-10-CM | POA: Diagnosis not present

## 2024-02-10 DIAGNOSIS — Z79899 Other long term (current) drug therapy: Secondary | ICD-10-CM | POA: Diagnosis not present

## 2024-04-18 DIAGNOSIS — E039 Hypothyroidism, unspecified: Secondary | ICD-10-CM | POA: Diagnosis not present

## 2024-10-18 NOTE — Progress Notes (Addendum)
 Dawn Butler                                          MRN: [id number]   10/18/2024   The [facility] Quality Team Specialist reviewed this patient medical record for the purposes of chart review for care gap closure. The following were reviewed: chart review for care gap closure-breast cancer screening.  11/23/2024- cannot close [facility] 2025    [facility] Quality Team
